# Patient Record
Sex: Female | Born: 1990
Health system: Southern US, Community
[De-identification: ages and names within clinical notes are randomized; demographics above are authoritative.]

## PROBLEM LIST (undated history)

## (undated) DIAGNOSIS — E282 Polycystic ovarian syndrome: Secondary | ICD-10-CM

## (undated) HISTORY — DX: Polycystic ovarian syndrome: E28.2

## (undated) HISTORY — PX: WISDOM TOOTH EXTRACTION: SHX21

---

## 2013-09-05 DIAGNOSIS — IMO0001 Reserved for inherently not codable concepts without codable children: Secondary | ICD-10-CM | POA: Insufficient documentation

## 2013-09-09 ENCOUNTER — Encounter: Payer: Self-pay | Admitting: Internal Medicine

## 2013-11-12 ENCOUNTER — Ambulatory Visit: Payer: Self-pay | Admitting: Internal Medicine

## 2014-05-19 ENCOUNTER — Encounter: Payer: Self-pay | Admitting: Internal Medicine

## 2014-06-19 ENCOUNTER — Encounter: Payer: Self-pay | Admitting: *Deleted

## 2014-07-22 ENCOUNTER — Encounter: Payer: Self-pay | Admitting: Internal Medicine

## 2014-07-22 ENCOUNTER — Ambulatory Visit (INDEPENDENT_AMBULATORY_CARE_PROVIDER_SITE_OTHER): Payer: 59 | Admitting: Internal Medicine

## 2014-07-22 VITALS — BP 104/60 | HR 60 | Ht 62.5 in | Wt 145.0 lb

## 2014-07-22 DIAGNOSIS — Z800 Family history of malignant neoplasm of digestive organs: Secondary | ICD-10-CM

## 2014-07-22 DIAGNOSIS — Z8371 Family history of colonic polyps: Secondary | ICD-10-CM

## 2014-07-22 MED ORDER — NA SULFATE-K SULFATE-MG SULF 17.5-3.13-1.6 GM/177ML PO SOLN: ORAL | Status: DC

## 2014-07-22 NOTE — Patient Instructions (Signed)
  You have been scheduled for a colonoscopy. Please follow written instructions given to you at your visit today.  Please pick up your prep supplies at the pharmacy within the next 1-3 days. If you use inhalers (even only as needed), please bring them with you on the day of your procedure. Your physician has requested that you go to www.startemmi.com and enter the access code given to you at your visit today. This web site gives a general overview about your procedure. However, you should still follow specific instructions given to you by our office regarding your preparation for the procedure.    I appreciate the opportunity to care for you.  

## 2014-07-23 NOTE — Progress Notes (Signed)
Patient ID: Shirley Chavez, female   DOB: 1990/03/08, 24 y.o.   MRN: [id number] HPI: Shirley Chavez is a 24 yo female with PMH of PCOS and family history significant for colorectal cancer in her mother diagnosed age 75 and ulcerative colitis in her sister along with possible colon polyps in her sister who is seen in consultation at the request of Dani Gobble, PA-C to evaluate for elevated risk screening colonoscopy. She is here alone today. 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 She is engaged to be married and plans to get married in September 2017. In general she reports she feels well. She does have stress which she associates with school. This causes sleep impairment and occasional heartburn. She uses [employer] for heartburn. No dysphagia or odynophagia. She denies abdominal pain. Reports bowel movements a been regular without diarrhea or constipation. She denies rectal bleeding or melena. Report considerable stress and anxiety over her mother's death from colon cancer and she often worries that she may suffer colon polyps or even colon cancer. Of note her sister has ulcerative colitis and reportedly had polyps removed today 14. It is unclear if these polyps were inflammatory, pseudopolyps, or precancerous polyps. She takes minimal medications but has a Mirena in place and uses Unisom for sleep. She has recently been diagnosed with PCOS and is on oral contraception  Past Medical History  Diagnosis Date  . PCOS (polycystic ovarian syndrome)     Past Surgical History  Procedure Laterality Date  . Wisdom tooth extraction      No outpatient prescriptions prior to visit.   No facility-administered medications prior to visit.    Allergies  Allergen Reactions  . Amoxicillin Rash  . Sulfa Antibiotics Hives    Family History  Problem Relation Age of Onset  . Colon cancer Mother 35  . Ulcerative colitis Sister   . Colon polyps  Sister   . Prostate cancer Maternal Grandfather   . Diabetes Maternal Grandfather   . Heart disease Paternal Grandfather   . Leukemia Paternal Grandmother     History  Substance Use Topics  . Smoking status: Never Smoker   . Smokeless tobacco: Never Used  . Alcohol Use: No    ROS: As per history of present illness, otherwise negative  BP 104/60 mmHg  Pulse 60  Ht 5' 2.5" (1.588 m)  Wt 145 lb (65.772 kg)  BMI 26.08 kg/m2 Constitutional: Well-developed and well-nourished. No distress. HEENT: Normocephalic and atraumatic. Oropharynx is clear and moist. No oropharyngeal exudate. Conjunctivae are normal.  No scleral icterus. Neck: Neck supple. Trachea midline. Cardiovascular: Normal rate, regular rhythm and intact distal pulses. No M/R/G Pulmonary/chest: Effort normal and breath sounds normal. No wheezing, rales or rhonchi. Abdominal: Soft, nontender, nondistended. Bowel sounds active throughout. There are no masses palpable. No hepatosplenomegaly. Extremities: no clubbing, cyanosis, or edema Lymphadenopathy: No cervical adenopathy noted. Neurological: Alert and oriented to person place and time. Skin: Skin is warm and dry. No rashes noted. Psychiatric: Normal mood and affect. Behavior is normal.   ASSESSMENT/PLAN: 24 yo female with PMH of PCOS and family history significant for colorectal cancer in her mother diagnosed age 75 and ulcerative colitis in her sister along with possible colon polyps in her sister who is seen in consultation at the request of Dani Gobble, PA-C to evaluate for elevated risk screening colonoscopy.   1. Family hx of colon cancer, colon polyps -- given her fear of  colon polyps and cancer along with significant family history of colon cancer in her mother at an early age and colon polyps in her sister, I recommended proceeding with screening colonoscopy at this time. If this test is negative will provide her with considerable reassurance and hopefully  take away fear of the unknown. She will require screening colonoscopy every 5 years given the significant family history. We discussed the test including the risks, benefits, and alternatives and she is agreeable to proceed.   ZO:XWRUEAV C [place], Md [address] [place], [place] [address]

## 2014-07-25 DIAGNOSIS — Z800 Family history of malignant neoplasm of digestive organs: Secondary | ICD-10-CM | POA: Insufficient documentation

## 2014-09-29 ENCOUNTER — Encounter: Payer: 59 | Admitting: Internal Medicine

## 2015-11-05 DIAGNOSIS — Z6821 Body mass index (BMI) 21.0-21.9, adult: Secondary | ICD-10-CM | POA: Diagnosis not present

## 2015-11-05 DIAGNOSIS — L293 Anogenital pruritus, unspecified: Secondary | ICD-10-CM | POA: Diagnosis not present

## 2016-06-13 DIAGNOSIS — Z6822 Body mass index (BMI) 22.0-22.9, adult: Secondary | ICD-10-CM | POA: Diagnosis not present

## 2016-06-13 DIAGNOSIS — L680 Hirsutism: Secondary | ICD-10-CM | POA: Diagnosis not present

## 2016-06-13 DIAGNOSIS — Z01419 Encounter for gynecological examination (general) (routine) without abnormal findings: Secondary | ICD-10-CM | POA: Diagnosis not present

## 2016-06-13 DIAGNOSIS — E282 Polycystic ovarian syndrome: Secondary | ICD-10-CM | POA: Diagnosis not present

## 2016-12-27 ENCOUNTER — Ambulatory Visit: Payer: Self-pay | Admitting: Physician Assistant

## 2016-12-27 ENCOUNTER — Encounter: Payer: Self-pay | Admitting: Physician Assistant

## 2016-12-27 VITALS — BP 100/70 | HR 98 | Temp 98.6°F

## 2016-12-27 DIAGNOSIS — R197 Diarrhea, unspecified: Secondary | ICD-10-CM

## 2016-12-27 NOTE — Progress Notes (Signed)
   Subjective: Diarrhea     Patient ID: Shirley Chavez, female    DOB: 1990-10-23, 26 y.o.   MRN: [id number]  HPI Patient complain of mild abdominal pain, nausea without vomiting, and diarrhea for 1 week. Patient denies fever/chills with these complaints. Patient is concerned because there is a family history of colon cancer. Patient state no relief with over-the-counter Pepto-Bismol. Patient describes the stool as watery. Patient is requested a consult to gastroenterology secondary to family history.   Review of Systems    negative except for complaint Objective:   Physical Exam Negative HSM, normoactive bowel sounds at this time. Soft nontender to palpation.       Assessment & Plan: Diarrhea   Advised over-the-counter Imodium at this time. Discussed consult for gastroenterology if no improvement in 3-5 days. Patient will follow up as needed.

## 2016-12-29 NOTE — Progress Notes (Signed)
Sent referral request to [facility] . Per patient requesting Dr Servando SnareWohl. Per Artist PaisShawna @ GI will contact patient of appointment left message today.

## 2016-12-29 NOTE — Addendum Note (Signed)
Addended by: Yvonne KendallBROWN, Mozell Hardacre D on: 12/29/2016 11:29 AM   Modules accepted: Orders

## 2017-02-09 ENCOUNTER — Encounter: Payer: Self-pay | Admitting: Gastroenterology

## 2017-02-09 ENCOUNTER — Other Ambulatory Visit: Payer: Self-pay

## 2017-02-09 ENCOUNTER — Ambulatory Visit (INDEPENDENT_AMBULATORY_CARE_PROVIDER_SITE_OTHER): Payer: 59 | Admitting: Gastroenterology

## 2017-02-09 VITALS — BP 121/75 | HR 111 | Ht 62.0 in | Wt 125.0 lb

## 2017-02-09 DIAGNOSIS — R197 Diarrhea, unspecified: Secondary | ICD-10-CM

## 2017-02-09 DIAGNOSIS — K921 Melena: Secondary | ICD-10-CM

## 2017-02-09 NOTE — Progress Notes (Signed)
Shirley BouillonVarnita Breylen Agyeman [address]  [address]  [place], [place] [address]  Main: [phone]  Fax: [phone]   Gastroenterology Consultation  Referring Provider:     Eartha InchBadger, Michael C, MD Primary Care Physician:  Eartha InchBadger, Michael C, MD Primary Gastroenterologist:  Dr. Melodie BouillonVarnita Semaje Kinker Reason for Consultation:     Diarrhea        HPI:   Shirley Chavez is a 27 y.o. y/o female referred for consultation & management  by Dr. Cyndia BentBadger, Kayleen MemosMichael C, MD.  Patient reports 02-9063-month history of diarrhea.  Initially started around Thanksgiving time, with around 5 loose bowel movements a day for a week with bright red blood on tissue paper at the time.  No bright red blood in the stool itself.  After that week, patient has had 2-3 bowel movements daily.  Prior to this, she was having one formed bowel movement daily.  Also reports bilateral lower quadrant cramping, 3/10 since then.  Intermittently worsens after meals.  She also had nausea when the symptoms first started but does not have any at this time.  No vomiting.  No fever or chills.  No weight loss.  No previous colonoscopy or EGDs.  No dysphagia.  Try to change her diet, including avoidance of gluten which did not help her symptoms.  Also describes urgency with her bowel movements since the symptoms started.  Has family history of colon cancer, mother was diagnosed with colon cancer at 27 years of age.  Sister was diagnosed with ulcerative colitis when she was 6014.  No recent changes in medications.  Uses naproxen about once every 2 weeks.  Past Medical History:  Diagnosis Date  . PCOS (polycystic ovarian syndrome)     Past Surgical History:  Procedure Laterality Date  . WISDOM TOOTH EXTRACTION      Prior to Admission medications   Medication Sig Start Date End Date Taking? Authorizing Provider  [facility] 1-35 MG-MCG tablet  06/24/14  Yes [provider]  levonorgestrel (MIRENA) 20 MCG/24HR IUD by Intrauterine route.   Yes  [provider]    Family History  Problem Relation Age of Onset  . Colon cancer Mother 5540  . Ulcerative colitis Sister   . Colon polyps Sister   . Prostate cancer Maternal Grandfather   . Diabetes Maternal Grandfather   . Heart disease Paternal Grandfather   . Leukemia Paternal Grandmother      Social History   Tobacco Use  . Smoking status: Never Smoker  . Smokeless tobacco: Never Used  Substance Use Topics  . Alcohol use: No    Alcohol/week: 0.0 oz  . Drug use: No    Allergies as of 02/09/2017 - Review Complete 02/09/2017  Allergen Reaction Noted  . Amoxicillin Rash 07/22/2014  . Sulfa antibiotics Hives 07/22/2014    Review of Systems:    All systems reviewed and negative except where noted in HPI.   Physical Exam:  BP 121/75   Pulse (!) 111   Ht 5\' 2"  (1.575 m)   Wt 125 lb (56.7 kg)   BMI 22.86 kg/m  No LMP recorded. Patient is not currently having periods (Reason: IUD). Psych:  Alert and cooperative. Normal mood and affect. General:   Alert,  Well-developed, well-nourished, pleasant and cooperative in NAD Head:  Normocephalic and atraumatic. Eyes:  Sclera clear, no icterus.   Conjunctiva pink. Ears:  Normal auditory acuity. Nose:  No deformity, discharge, or lesions. Mouth:  No deformity or lesions,oropharynx pink & moist. Neck:  Supple; no masses or thyromegaly. Lungs:  Respirations even and unlabored.  Clear throughout to auscultation.   No wheezes, crackles, or rhonchi. No acute distress. Heart:  Regular rate and rhythm; no murmurs, clicks, rubs, or gallops.  No edema Abdomen:  Normal bowel sounds.  No bruits.  Soft, mildly tender to palpation bilateral lower quadrant, and non-distended without masses, no hepatosplenomegaly or hernias noted.  No guarding or rebound tenderness.    Extremities:  No clubbing or edema.  No cyanosis. Neurologic:  Alert and oriented x3;  grossly normal neurologically. Skin:  Intact without significant lesions or  rashes. No jaundice. Lymph Nodes:  No significant cervical adenopathy. Psych:  Alert and cooperative. Normal mood and affect.   Labs: CBC 2017 labs available in care everywhere were reviewed.  No anemia present in that lab work. Imaging Studies: No results found.  Assessment and Plan:   Shirley Chavez is a 27 y.o. y/o female has been referred for abdominal pain and diarrhea for 1-2 months with his family history of colon cancer and family history of ulcerative colitis.  Patient's diarrhea could be due to postinfectious syndrome, postinfectious IBS However, she also has urgency and bilateral lower quadrant abdominal pain since her symptoms started, and has family history of ulcerative colitis.  Therefore, further investigation is needed to evaluate for IBD.  She is also the right age when IBD presents. We will start with blood work, including inflammatory markers.  We will also obtain celiac serology. After blood work is reviewed, we will discuss it with patient further, and if symptoms persist, proceed with colonoscopy to evaluate for IBD. She is also eligible for higher risk cancer screening given her family history of colon cancer.  This would start at 27 years of age for her. Obtaining a CT scan, after lab work is obtained is also an alternative to colonoscopy.  This was discussed with the patient. We will be in touch with her after lab work is obtained to discuss further steps.  She is agreeable with this plan. Since symptoms are chronic, stool cultures are unlikely to be positive.  She would like to start with blood work only at this time, which is reasonable.  Dr Shirley Chavez

## 2017-02-13 LAB — COMPREHENSIVE METABOLIC PANEL
ALK PHOS: 40 IU/L (ref 39–117)
ALT: 10 IU/L (ref 0–32)
AST: 18 IU/L (ref 0–40)
Albumin/Globulin Ratio: 1.7 (ref 1.2–2.2)
Albumin: 4.6 g/dL (ref 3.5–5.5)
BUN/Creatinine Ratio: 10 (ref 9–23)
BUN: 7 mg/dL (ref 6–20)
Bilirubin Total: 0.2 mg/dL (ref 0.0–1.2)
CALCIUM: 9.7 mg/dL (ref 8.7–10.2)
CO2: 24 mmol/L (ref 20–29)
CREATININE: 0.67 mg/dL (ref 0.57–1.00)
Chloride: 103 mmol/L (ref 96–106)
GFR calc Af Amer: 140 mL/min/{1.73_m2} (ref >59)
GFR, EST NON AFRICAN AMERICAN: 122 mL/min/{1.73_m2} (ref >59)
Globulin, Total: 2.7 g/dL (ref 1.5–4.5)
Glucose: 89 mg/dL (ref 65–99)
POTASSIUM: 3.9 mmol/L (ref 3.5–5.2)
Sodium: 141 mmol/L (ref 134–144)
Total Protein: 7.3 g/dL (ref 6.0–8.5)

## 2017-02-13 LAB — CBC WITH DIFFERENTIAL/PLATELET
BASOS ABS: 0.1 10*3/uL (ref 0.0–0.2)
Basos: 1 %
EOS (ABSOLUTE): 0.1 10*3/uL (ref 0.0–0.4)
EOS: 1 %
Hematocrit: 37.6 % (ref 34.0–46.6)
Hemoglobin: 12.5 g/dL (ref 11.1–15.9)
IMMATURE GRANULOCYTES: 0 %
Immature Grans (Abs): 0 10*3/uL (ref 0.0–0.1)
Lymphocytes Absolute: 3 10*3/uL (ref 0.7–3.1)
Lymphs: 35 %
MCH: 30.2 pg (ref 26.6–33.0)
MCHC: 33.2 g/dL (ref 31.5–35.7)
MCV: 91 fL (ref 79–97)
MONOS ABS: 0.4 10*3/uL (ref 0.1–0.9)
Monocytes: 4 %
NEUTROS PCT: 59 %
Neutrophils Absolute: 5.1 10*3/uL (ref 1.4–7.0)
PLATELETS: 299 10*3/uL (ref 150–379)
RBC: 4.14 x10E6/uL (ref 3.77–5.28)
RDW: 12.4 % (ref 12.3–15.4)
WBC: 8.5 10*3/uL (ref 3.4–10.8)

## 2017-02-13 LAB — SEDIMENTATION RATE: SED RATE: 6 mm/h (ref 0–32)

## 2017-02-13 LAB — IGA: IgA/Immunoglobulin A, Serum: 62 mg/dL — ABNORMAL LOW (ref 87–352)

## 2017-02-13 LAB — TISSUE TRANSGLUTAMINASE ABS,IGG,IGA

## 2017-02-13 LAB — C-REACTIVE PROTEIN: CRP: 4.9 mg/L (ref 0.0–4.9)

## 2017-02-27 ENCOUNTER — Ambulatory Visit: Payer: Self-pay | Admitting: Gastroenterology

## 2017-05-08 ENCOUNTER — Ambulatory Visit: Payer: 59 | Admitting: Gastroenterology

## 2017-05-15 ENCOUNTER — Ambulatory Visit: Payer: Self-pay | Admitting: Gastroenterology

## 2017-07-03 ENCOUNTER — Ambulatory Visit: Payer: 59 | Admitting: Gastroenterology

## 2017-07-10 DIAGNOSIS — Z01419 Encounter for gynecological examination (general) (routine) without abnormal findings: Secondary | ICD-10-CM | POA: Diagnosis not present

## 2017-07-10 DIAGNOSIS — Z6822 Body mass index (BMI) 22.0-22.9, adult: Secondary | ICD-10-CM | POA: Diagnosis not present

## 2017-07-10 DIAGNOSIS — E282 Polycystic ovarian syndrome: Secondary | ICD-10-CM | POA: Diagnosis not present

## 2017-09-15 ENCOUNTER — Other Ambulatory Visit: Payer: Self-pay | Admitting: Family Medicine

## 2017-09-15 MED ORDER — PERMETHRIN 5 % EX CREA: 1.0000 "application " | TOPICAL_CREAM | Freq: Once | CUTANEOUS | 0 refills | Status: AC

## 2017-10-31 ENCOUNTER — Encounter: Payer: Self-pay | Admitting: Physician Assistant

## 2017-10-31 ENCOUNTER — Ambulatory Visit: Payer: Self-pay | Admitting: Physician Assistant

## 2017-10-31 VITALS — BP 110/86 | HR 99 | Temp 97.8°F | Wt 123.0 lb

## 2017-10-31 DIAGNOSIS — N3001 Acute cystitis with hematuria: Secondary | ICD-10-CM

## 2017-10-31 DIAGNOSIS — R300 Dysuria: Secondary | ICD-10-CM

## 2017-10-31 LAB — POCT URINALYSIS DIPSTICK
Bilirubin, UA: NEGATIVE
Blood, UA: POSITIVE
Glucose, UA: NEGATIVE
Ketones, UA: NEGATIVE
LEUKOCYTES UA: NEGATIVE
NITRITE UA: NEGATIVE
PROTEIN UA: NEGATIVE
Urobilinogen, UA: 0.2 E.U./dL
pH, UA: 5 (ref 5.0–8.0)

## 2017-10-31 MED ORDER — NITROFURANTOIN MONOHYD MACRO 100 MG PO CAPS: 100.0000 mg | ORAL_CAPSULE | Freq: Two times a day (BID) | ORAL | 0 refills | Status: AC

## 2017-10-31 MED ORDER — ONDANSETRON HCL 4 MG PO TABS: 4.0000 mg | ORAL_TABLET | Freq: Three times a day (TID) | ORAL | 0 refills | Status: AC | PRN

## 2017-10-31 MED ORDER — PHENAZOPYRIDINE HCL 200 MG PO TABS: 200.0000 mg | ORAL_TABLET | Freq: Three times a day (TID) | ORAL | 0 refills | Status: AC | PRN

## 2017-10-31 NOTE — Patient Instructions (Addendum)
Thank you for choosing [facility] for your health care needs today.  Your symptoms are consistent with a UTI. Your urinalysis performed in office today revealed specific gravity of <1.005, scant blood, no leukocytes, and no nitrites.   Take antibiotic, Macrobid, as prescribed. 1 pill bid x 5 days. Prescribed Zofran, for nausea secondary to antibiotic. Prescribed Pyridium, for dysuria.  Follow-up at PCP's office or urgent care in 2 days if symptoms not improving, you will most likely need your urine sent out for culture for further evaluation.  Urinary Tract Infection, Adult  A urinary tract infection (UTI) is an infection of any part of the urinary tract. The urinary tract includes the:  Kidneys.  Ureters.  Bladder.  Urethra.  These organs make, store, and get rid of pee (urine) in the body. Follow these instructions at home:  Take over-the-counter and prescription medicines only as told by your doctor.  If you were prescribed an antibiotic medicine, take it as told by your doctor. Do not stop taking the antibiotic even if you start to feel better.  Avoid the following drinks: ? Alcohol. ? Caffeine. ? Tea. ? Carbonated drinks.  Drink enough fluid to keep your pee clear or pale yellow.  Keep all follow-up visits as told by your doctor. This is important.  Make sure to: ? Empty your bladder often and completely. Do not to hold pee for long periods of time. ? Empty your bladder before and after sex. ? Wipe from front to back after a bowel movement if you are female. Use each tissue one time when you wipe. Contact a doctor if:  You have back pain.  You have a fever.  You feel sick to your stomach (nauseous).  You throw up (vomit).  Your symptoms do not get better after 3 days.  Your symptoms go away and then come back. Get help right away if:  You have very bad back pain.  You have very bad lower belly (abdominal) pain.  You are throwing up and cannot keep  down any medicines or water. This information is not intended to replace advice given to you by your health care provider. Make sure you discuss any questions you have with your health care provider. Document Released: 07/06/2007 Document Revised: 06/25/2015 Document Reviewed: 12/08/2014 Elsevier Interactive Patient Education  [employer].

## 2017-10-31 NOTE — Progress Notes (Signed)
Patient ID: Shirley Chavez DOB: 08-04-1990 AGE: 27 y.o. MRN: [id number]   PCP: Eartha Inch, MD   Chief Complaint:  Chief Complaint  Patient presents with  . save-dysuria     Subjective:    HPI:  Shirley Chavez is a 27 y.o. female presents for evaluation  Chief Complaint  Patient presents with  . save-dysuria   27 year old female presents to [facility] with 2 day history of dysuria. Burning sensation at start of urinary stream. Moderate in severity. Associated hematuria (small amount yesterday when wiping), increased urinary frequency, nocturia (was up all night urinating), sensation of incomplete voiding, scant amount of urine when urinating, and suprapubic pressure/discomfort. Symptoms persistent. Has taken OTC Azo and increased fluid intake with no symptom improvement. Patient with one previous UTI in her history, 7 years ago, current symptoms feel similar.  Patient in monogamous relationship with one partner. Denies concern for STI.  Patient denies fever, chills, headache, back ache, abdominal pain, nausea/vomiting, vaginal rash/discharge/pruritis, urinary incontinence.  A complete, at least 10 system review of symptoms was performed, pertinent positives and negatives as mentioned in HPI, otherwise negative.  The following portions of the patient's history were reviewed and updated as appropriate: allergies, current medications and past medical history.  There are no active problems to display for this patient.   Allergies  Allergen Reactions  . Amoxicillin Rash  . Sulfa Antibiotics Hives    Current Outpatient Medications on File Prior to Visit  Medication Sig Dispense Refill  . levonorgestrel (MIRENA) 20 MCG/24HR IUD by Intrauterine route.     No current facility-administered medications on file prior to visit.        Objective:    Vitals:   10/31/17 1132  BP: 110/86  Pulse: 99  Temp: 97.8 F (36.6 C)  SpO2: 100%     Wt Readings from Last 3  Encounters:  10/31/17 123 lb (55.8 kg)  02/09/17 125 lb (56.7 kg)  07/22/14 145 lb (65.8 kg)    Physical Exam:   General Appearance:  Alert, cooperative, no distress, appears stated age. Afebrile.  Head:  Normocephalic, without obvious abnormality, atraumatic  Eyes:  PERRL, conjunctiva/corneas clear, EOM's intact, fundi benign, both eyes  Ears:  Normal TM's and external ear canals, both ears  Nose: Nares normal, septum midline,mucosa normal, no drainage or sinus tenderness  Throat: Lips, mucosa, and tongue normal; teeth and gums normal  Neck: Supple, symmetrical, trachea midline, no adenopathy;  thyroid: not enlarged, symmetric, no tenderness/mass/nodules; no carotid bruit or JVD  Back:   Symmetric, no curvature, ROM normal, no CVA tenderness to percussion bilaterally.  Lungs:   Clear to auscultation bilaterally, respirations unlabored  Heart:  Regular rate and rhythm, S1 and S2 normal, no murmur, rub, or gallop  Abdomen:   Soft, non-tender, bowel sounds active all four quadrants,  no masses, no organomegaly  Extremities: Extremities normal, atraumatic, no cyanosis or edema  Pulses: 2+ and symmetric  Skin: Skin color, texture, turgor normal, no rashes or lesions  Lymph nodes: Cervical, supraclavicular, and axillary nodes normal  Neurologic: Normal    Assessment & Plan:    Exam findings, diagnosis etiology and medication use and indications reviewed with patient. Follow-Up and discharge instructions provided. No emergent/urgent issues found on exam.  Patient education was provided.   Patient verbalized understanding of information provided and agrees with plan of care (POC), all questions answered. The patient is advised to call or return to clinic if condition does not see an improvement in  symptoms, or to seek the care of the closest emergency department if condition worsens with the above plan.   Urinalysis performed in office today, 10/31/2017, revealed: Blood + 10/RBC/uL Bil  - neg Uro +- norm Ket - neg Pro - neg Nit - neg Glu - neg P.H 5.0 S.G <1.005 Leu - neg Col Lt. Yellow Cla Clear  1. Dysuria  - POCT Urinalysis Dipstick - nitrofurantoin, macrocrystal-monohydrate, (MACROBID) 100 MG capsule; Take 1 capsule (100 mg total) by mouth 2 (two) times daily for 5 days.  Dispense: 10 capsule; Refill: 0 - phenazopyridine (PYRIDIUM) 200 MG tablet; Take 1 tablet (200 mg total) by mouth 3 (three) times daily as needed for up to 4 days for pain.  Dispense: 10 tablet; Refill: 0  2. Acute cystitis with hematuria  - nitrofurantoin, macrocrystal-monohydrate, (MACROBID) 100 MG capsule; Take 1 capsule (100 mg total) by mouth 2 (two) times daily for 5 days.  Dispense: 10 capsule; Refill: 0   Patient with 2 day history of UTI symptoms. UA very dilute; however, hematuria. Based on symptoms will treat patient with 5-day course of Macrobid and Pyridium for UTI. Patient reports nausea secondary to antibiotics, prescribed Zofran. Patient advised to f/u at PCP's office or urgent care in 2 days if symptoms not improving, will need a repeat UA and a urine culture. Patient agrees with plan.  Rulon Sera, MHS, PA-C Advanced Practice Provider [facility]  [address], [facility], 1st Floor [place], [place] [address] (p):  [phone] [email or web address]

## 2017-11-03 ENCOUNTER — Telehealth: Payer: Self-pay | Admitting: Emergency Medicine

## 2017-11-03 NOTE — Telephone Encounter (Signed)
Attempted to contact patient unable to mailbox is full This was a follow up call from an [place] visit

## 2018-02-26 ENCOUNTER — Ambulatory Visit: Payer: Self-pay | Admitting: Family Medicine

## 2018-03-15 ENCOUNTER — Ambulatory Visit: Payer: Self-pay | Admitting: Family Medicine

## 2018-04-17 ENCOUNTER — Encounter: Payer: Self-pay | Admitting: Family Medicine

## 2018-04-17 ENCOUNTER — Ambulatory Visit (INDEPENDENT_AMBULATORY_CARE_PROVIDER_SITE_OTHER): Payer: No Typology Code available for payment source | Admitting: Family Medicine

## 2018-04-17 ENCOUNTER — Ambulatory Visit: Payer: Self-pay | Admitting: Family Medicine

## 2018-04-17 ENCOUNTER — Other Ambulatory Visit: Payer: Self-pay

## 2018-04-17 VITALS — BP 106/68 | HR 88 | Temp 98.0°F | Resp 16 | Ht 62.0 in | Wt 126.2 lb

## 2018-04-17 DIAGNOSIS — K5900 Constipation, unspecified: Secondary | ICD-10-CM

## 2018-04-17 DIAGNOSIS — Z800 Family history of malignant neoplasm of digestive organs: Secondary | ICD-10-CM

## 2018-04-17 DIAGNOSIS — E282 Polycystic ovarian syndrome: Secondary | ICD-10-CM

## 2018-04-17 DIAGNOSIS — Z114 Encounter for screening for human immunodeficiency virus [HIV]: Secondary | ICD-10-CM

## 2018-04-17 DIAGNOSIS — K625 Hemorrhage of anus and rectum: Secondary | ICD-10-CM

## 2018-04-17 DIAGNOSIS — Z1159 Encounter for screening for other viral diseases: Secondary | ICD-10-CM

## 2018-04-17 DIAGNOSIS — E7800 Pure hypercholesterolemia, unspecified: Secondary | ICD-10-CM

## 2018-04-17 DIAGNOSIS — Z975 Presence of (intrauterine) contraceptive device: Secondary | ICD-10-CM

## 2018-04-17 NOTE — Progress Notes (Signed)
Name: Shirley Chavez   MRN: [id number]    DOB: 07/27/1990   Date:04/17/2018       Progress Note  Subjective  Chief Complaint  Chief Complaint  Patient presents with  . Establish Care  . Referral    GI, IUD/pap for GYN    HPI  Pt presents to establish care and for the following:  Diarrhea: Had viral illness that caused significant diarrhea a few years ago, did go to see GI at that point but was told it was post-infectious diarrhea, improved for about a year after decreasing fiber intake, but is now experiencing constipation, and intermittent blood in stool.  Has increased her fiber, and this has helped a little bit, but still concerned.  No abdominal pain, nausea, or vomiting. Her mother has stage 4 CRC at age 1 and passed away from this, sister has ulcerative colitis.   We will refer today.  IUD in place, due for Pap: Has had IUD (Mirena) in place for 7 years.  Used to see OB/GYN in [facility], needs new referral.  Used to be OCP and stopped because she had N/V.  Wants to have IUD switched out and have Pap.  PCOS: Ongoing for many years, has Mirena in place without issues.  No polydipsia, polyuria, or polyphagia.  Does note history of elevated LDL cholesterol - will check labs today.  Patient Active Problem List   Diagnosis Date Noted  . PCOS (polycystic ovarian syndrome) 04/17/2018  . FH: colon cancer 07/25/2014  . Well adult 09/05/2013    Past Surgical History:  Procedure Laterality Date  . WISDOM TOOTH EXTRACTION      Family History  Problem Relation Age of Onset  . Colon cancer Mother 58  . Ulcerative colitis Sister   . Colon polyps Sister   . Prostate cancer Maternal Grandfather   . Diabetes Maternal Grandfather   . Heart disease Paternal Grandfather   . Heart attack Paternal Grandfather   . Leukemia Paternal Grandmother   . Hypertension Father   . Diabetes Maternal Grandmother   . Stroke Maternal Grandmother     Social History   Socioeconomic History  .  Marital status: Married    Spouse name: Issack  . Number of children: 0  . Years of education: 109  . Highest education level: Bachelor's degree (e.g., BA, AB, BS)  Occupational History  . Not on file  Social Needs  . Financial resource strain: Not hard at all  . Food insecurity:    Worry: Never true    Inability: Never true  . Transportation needs:    Medical: No    Non-medical: No  Tobacco Use  . Smoking status: Never Smoker  . Smokeless tobacco: Never Used  Substance and Sexual Activity  . Alcohol use: No    Alcohol/week: 0.0 standard drinks  . Drug use: No  . Sexual activity: Yes  Lifestyle  . Physical activity:    Days per week: 2 days    Minutes per session: 30 min  . Stress: Not at all  Relationships  . Social connections:    Talks on phone: Three times a week    Gets together: Once a week    Attends religious service: Never    Active member of club or organization: No    Attends meetings of clubs or organizations: Never    Relationship status: Married  . Intimate partner violence:    Fear of current or ex partner: No    Emotionally  abused: No    Physically abused: No    Forced sexual activity: No  Other Topics Concern  . Not on file  Social History Narrative  . Not on file     Current Outpatient Medications:  .  levonorgestrel (MIRENA) 20 MCG/24HR IUD, by Intrauterine route., Disp: , Rfl:   Allergies  Allergen Reactions  . Amoxicillin Rash  . Sulfa Antibiotics Hives    I personally reviewed active problem list, medication list, allergies, health maintenance, lab results with the patient/caregiver today.   ROS  Constitutional: Negative for fever or weight change.  Respiratory: Negative for cough and shortness of breath.   Cardiovascular: Negative for chest pain or palpitations.  Gastrointestinal: Negative for abdominal pain, no bowel changes.  Musculoskeletal: Negative for gait problem or joint swelling.  Skin: Negative for rash.   Neurological: Negative for dizziness or headache.  No other specific complaints in a complete review of systems (except as listed in HPI above).  Objective  Vitals:   04/17/18 1305  BP: 106/68  Pulse: 88  Resp: 16  Temp: 98 F (36.7 C)  TempSrc: Oral  SpO2: 97%  Weight: 126 lb 3.2 oz (57.2 kg)  Height: 5\' 2"  (1.575 m)   Body mass index is 23.08 kg/m.  Physical Exam  Constitutional: Patient appears well-developed and well-nourished. No distress.  HEENT: head atraumatic, normocephalic Cardiovascular: Normal rate, regular rhythm and normal heart sounds.  No murmur heard. No BLE edema. Pulmonary/Chest: Effort normal and breath sounds clear bilaterally. No respiratory distress. Abdominal: Soft, bowel sounds normal, there is no tenderness, no HSM Psychiatric: Patient has a normal mood and affect. behavior is normal. Judgment and thought content normal.  No results found for this or any previous visit (from the past 72 hour(s)).  PHQ2/9: Depression screen PHQ 2/9 04/17/2018  Decreased Interest 0  Down, Depressed, Hopeless 0  PHQ - 2 Score 0  Altered sleeping 0  Tired, decreased energy 0  Change in appetite 0  Feeling bad or failure about yourself  0  Trouble concentrating 0  Suicidal thoughts 0  PHQ-9 Score 0  Difficult doing work/chores Not difficult at all   PHQ-2/9 Result is negative.    Fall Risk: Fall Risk  04/17/2018  Falls in the past year? 0  Number falls in past yr: 0  Injury with Fall? 0   Functional Status Survey: Is the patient deaf or have difficulty hearing?: No Does the patient have difficulty seeing, even when wearing glasses/contacts?: Yes Does the patient have difficulty concentrating, remembering, or making decisions?: No Does the patient have difficulty walking or climbing stairs?: No Does the patient have difficulty dressing or bathing?: No Does the patient have difficulty doing errands alone such as visiting a doctor's office or shopping?: No   Assessment & Plan  1. PCOS (polycystic ovarian syndrome) - COMPLETE METABOLIC PANEL WITH GFR - TSH  2. Constipation, unspecified constipation type - Ambulatory referral to Gastroenterology - CBC with Differential/Platelet  3. Family history of colorectal cancer - Ambulatory referral to Gastroenterology - CBC with Differential/Platelet  4. Encounter for screening for HIV - HIV Antibody (routine testing w rflx)  5. Need for hepatitis C screening test - Hepatitis C antibody  6. Elevated LDL cholesterol level - Lipid panel - TSH  7. Rectal bleeding - CBC with Differential/Platelet  8. IUD (intrauterine device) in place - Ambulatory referral to Gynecology

## 2018-04-18 LAB — COMPLETE METABOLIC PANEL WITH GFR
AG Ratio: 2 (calc) (ref 1.0–2.5)
ALBUMIN MSPROF: 5.1 g/dL (ref 3.6–5.1)
ALKALINE PHOSPHATASE (APISO): 63 U/L (ref 31–125)
ALT: 28 U/L (ref 6–29)
AST: 28 U/L (ref 10–30)
BUN: 10 mg/dL (ref 7–25)
CALCIUM: 10.4 mg/dL — AB (ref 8.6–10.2)
CO2: 26 mmol/L (ref 20–32)
CREATININE: 0.81 mg/dL (ref 0.50–1.10)
Chloride: 102 mmol/L (ref 98–110)
GFR, EST AFRICAN AMERICAN: 115 mL/min/{1.73_m2} (ref >=60)
GFR, EST NON AFRICAN AMERICAN: 100 mL/min/{1.73_m2} (ref >=60)
GLOBULIN: 2.6 g/dL (ref 1.9–3.7)
Glucose, Bld: 82 mg/dL (ref 65–99)
Potassium: 4.2 mmol/L (ref 3.5–5.3)
Sodium: 139 mmol/L (ref 135–146)
TOTAL PROTEIN: 7.7 g/dL (ref 6.1–8.1)
Total Bilirubin: 0.6 mg/dL (ref 0.2–1.2)

## 2018-04-18 LAB — CBC WITH DIFFERENTIAL/PLATELET
ABSOLUTE MONOCYTES: 330 {cells}/uL (ref 200–950)
BASOS ABS: 72 {cells}/uL (ref 0–200)
Basophils Relative: 1.2 %
EOS ABS: 120 {cells}/uL (ref 15–500)
Eosinophils Relative: 2 %
HEMATOCRIT: 36.8 % (ref 35.0–45.0)
Hemoglobin: 12.5 g/dL (ref 11.7–15.5)
LYMPHS ABS: 2226 {cells}/uL (ref 850–3900)
MCH: 29.8 pg (ref 27.0–33.0)
MCHC: 34 g/dL (ref 32.0–36.0)
MCV: 87.6 fL (ref 80.0–100.0)
MPV: 12.9 fL — ABNORMAL HIGH (ref 7.5–12.5)
Monocytes Relative: 5.5 %
NEUTROS PCT: 54.2 %
Neutro Abs: 3252 cells/uL (ref 1500–7800)
Platelets: 261 10*3/uL (ref 140–400)
RBC: 4.2 10*6/uL (ref 3.80–5.10)
RDW: 11.6 % (ref 11.0–15.0)
Total Lymphocyte: 37.1 %
WBC: 6 10*3/uL (ref 3.8–10.8)

## 2018-04-18 LAB — HEPATITIS C ANTIBODY
Hepatitis C Ab: NONREACTIVE
SIGNAL TO CUT-OFF: 0.01 (ref <1.00)

## 2018-04-18 LAB — LIPID PANEL
Cholesterol: 195 mg/dL (ref <200)
HDL: 66 mg/dL (ref >=50)
LDL Cholesterol (Calc): 108 mg/dL (calc) — ABNORMAL HIGH
NON-HDL CHOLESTEROL (CALC): 129 mg/dL (ref <130)
Total CHOL/HDL Ratio: 3 (calc) (ref <5.0)
Triglycerides: 109 mg/dL (ref <150)

## 2018-04-18 LAB — HIV ANTIBODY (ROUTINE TESTING W REFLEX): HIV 1&2 Ab, 4th Generation: NONREACTIVE

## 2018-04-18 LAB — TSH: TSH: 1.53 mIU/L

## 2018-04-27 ENCOUNTER — Ambulatory Visit (INDEPENDENT_AMBULATORY_CARE_PROVIDER_SITE_OTHER): Payer: Self-pay | Admitting: Gastroenterology

## 2018-04-27 ENCOUNTER — Encounter: Payer: Self-pay | Admitting: Gastroenterology

## 2018-04-27 DIAGNOSIS — K625 Hemorrhage of anus and rectum: Secondary | ICD-10-CM

## 2018-04-27 NOTE — Progress Notes (Signed)
Shirley Bouillon, MD [address]  [address]  [place], [place] [address]  Main: [phone]  Fax: [phone]   Primary Care Physician: Doren Custard, FNP  Virtual Visit via Telephone Note  I connected with patient on 04/27/18 at 11:00 AM EDT by telephone and verified that I am speaking with the correct person using two identifiers.   I discussed the limitations, risks, security and privacy concerns of performing an evaluation and management service by telephone and the availability of in person appointments. I also discussed with the patient that there may be a patient responsible charge related to this service. The patient expressed understanding and agreed to proceed.  Location of Patient: Work, in a Armed forces training and education officer of Provider: Home Persons involved: Patient and provider only   History of Present Illness: Chief Complaint  Patient presents with  . Constipation    states she was seen by you last year   . 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  Patient's work-up at that time was reassuring, and the options of colonoscopy versus conservative management were discussed in detail and based on discussion with the patient, patient chose conservative management at the time.  Patient states since her last visit, she had been doing well and her diarrhea had completely resolved and she had developed constipation.  She varied her fiber intake and at times she will have loose stools because of this and then varies her fiber intake again.  Overall, states she is doing well and denies any alarm symptoms.  Over the last week, reports 1-2 formed bowel movements a day.  However, does state that she has occasional episodes of straining and then will see bright red blood on the toilet paper only and none in the stool itself.  This is occurred about once or twice.   The patient denies abdominal or flank pain, anorexia, nausea or vomiting, dysphagia, black stools or weight loss.  Family history significant for mother diagnosed with colon cancer, stage IV at 80 years of age.  Sister was diagnosed with ulcerative colitis when she was 61.  Due to her family history, patient states she always worries if everything is normal, and previously had decided against colonoscopy, but would like to consider it at this time.  We had previously obtained work-up for celiac serology which had been negative a year ago.  She is able to eat gluten without any symptoms.  She does state that when she consumes dairy or lactose products she feels abdominal bloating, but as long as she avoids lactose the symptoms do not recur.  Current Outpatient Medications  Medication Sig Dispense Refill  . fexofenadine-pseudoephedrine (ALLEGRA-D 24) 180-240 MG 24 hr tablet Take 1 tablet by mouth daily.    Marland Kitchen levonorgestrel (MIRENA) 20 MCG/24HR IUD by Intrauterine route.     No current facility-administered medications for this visit.     Allergies as of 04/27/2018 - Review Complete 04/27/2018  Allergen Reaction Noted  . Amoxicillin Rash 07/22/2014  . Sulfa antibiotics Hives 07/22/2014    Review of Systems:    All systems reviewed and negative except where noted in HPI.   Observations/Objective:  Labs: CMP     Component Value Date/Time   NA 139 04/17/2018 1343   NA 141 02/09/2017 1630   K 4.2 04/17/2018 1343   CL 102 04/17/2018  1343   CO2 26 04/17/2018 1343   GLUCOSE 82 04/17/2018 1343   BUN 10 04/17/2018 1343   BUN 7 02/09/2017 1630   CREATININE 0.81 04/17/2018 1343   CALCIUM 10.4 (H) 04/17/2018 1343   PROT 7.7 04/17/2018 1343   PROT 7.3 02/09/2017 1630   ALBUMIN 4.6 02/09/2017 1630   AST 28 04/17/2018 1343   ALT 28 04/17/2018 1343   ALKPHOS 40 02/09/2017 1630   BILITOT 0.6 04/17/2018 1343   BILITOT 0.2 02/09/2017 1630   GFRNONAA 100 04/17/2018 1343   GFRAA 115  04/17/2018 1343   Lab Results  Component Value Date   WBC 6.0 04/17/2018   HGB 12.5 04/17/2018   HCT 36.8 04/17/2018   MCV 87.6 04/17/2018   PLT 261 04/17/2018    Imaging Studies: No results found.  Assessment and Plan:   Shirley Chavez is a 28 y.o. 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  Her symptoms are very reassuring and she agrees with the same.  She does not feel any external hemorrhoids, however, she has very intermittent bright red blood per rectum, which only occurs when straining and she has some itching in the area as well when this occurs, so she likely has underlying internal hemorrhoids.  However, since she has had to vary her fiber intake throughout the year to finally maintain normal bowel movements, and has had alternating constipation and diarrhea throughout the year, a colonoscopy to rule out IBD and obtain biopsies for microscopic colitis is reasonable at this time.  She does not have any upper GI symptoms at this time, we also discussed the possibility of EGD with a colonoscopy to rule out any upper GI bleed lesions.  She is not interested in the EGD at this time which is reasonable as well.  I have discussed alternative options, risks & benefits,  which include, but are not limited to, bleeding, infection, perforation,respiratory complication & drug reaction.  The patient agrees with this plan & written consent will be obtained.    Due to the current COVID-19 situation,  elective procedures are currently being scheduled for a later time as per nationwide recommendations. Therefore, the patient was informed of the need to schedule the procedure in the upcoming months, instead of sooner, since this is an elective procedure. Patient will need to be contacted at a later time to place him/her on the schedule. However, alarm symptoms were discussed in detail, and if these occur pt was advised to call us to discuss change in symptoms and evaluation for a more urgent procedure if appropriate. No indication for urgent procedure exist at this time. Patient was given the contact information to reach [place] with any questions, concerns, or change in symptoms.   High-fiber diet MiraLAX or Metamucil daily with goal of 1-2 soft bowel movements daily.  If not at goal, patient instructed to increase dose to twice daily.  If loose stools with the medication, patient asked to decrease the medication to every other day, or half  dose daily.  Patient verbalized understanding  Follow Up Instructions: I have asked the patient to call us in 2 to 3 months if she has not already heard from [place] by that time to schedule her colonoscopy.  Her colonoscopy would be for intermittent bright red blood per rectum.  We will contact her in the next 2 to 3 months once we are back to a normal schedule,  to schedule her elective procedure.  I have asked her to call us with any changes in symptoms or any questions or concerns and she verbalized understanding.   I discussed the assessment and treatment plan with the patient. The patient was provided an opportunity to ask questions and all were answered. The patient agreed with the plan and demonstrated an understanding of the instructions.   The patient was advised to call back or seek an in-person evaluation if the symptoms worsen or if the condition fails to improve as anticipated.  I provided 12 minutes of non-face-to-face time during this encounter.   Pasty Spillers, MD  Speech recognition software was used to dictate this note.

## 2018-07-02 ENCOUNTER — Telehealth: Payer: Self-pay

## 2018-07-02 NOTE — Telephone Encounter (Signed)
-----   Message from Pasty Spillers, MD sent at 06/11/2018 12:04 PM EDT ----- Please let the pt know we are now opening procedure scheduling slowly. If she is agreeable, schedule her for a Tuesday in [place]. I am trying to start filling Tuesday slots in [place].

## 2018-07-02 NOTE — Telephone Encounter (Signed)
LMTCO. Need to schedule colonoscopy for intermittent BRBPR. Left on message, would she want to have this done in [place].

## 2018-08-17 ENCOUNTER — Other Ambulatory Visit: Payer: Self-pay

## 2018-08-17 ENCOUNTER — Ambulatory Visit
Admission: EM | Admit: 2018-08-17 | Discharge: 2018-08-17 | Disposition: A | Discharge status: Home / Self Care | Payer: No Typology Code available for payment source | Attending: Family Medicine | Admitting: Family Medicine

## 2018-08-17 DIAGNOSIS — Z20828 Contact with and (suspected) exposure to other viral communicable diseases: Secondary | ICD-10-CM

## 2018-08-17 DIAGNOSIS — Z20822 Contact with and (suspected) exposure to covid-19: Secondary | ICD-10-CM

## 2018-08-17 NOTE — ED Triage Notes (Signed)
Patient states that she was exposed to Covid by a co-worker. Patient states that health at work will not swab her until Monday. Patient has no symptoms.

## 2018-08-18 NOTE — ED Provider Notes (Signed)
[facility]    CSN: [id number] Arrival date & time: 08/17/18  1726  History   Chief Complaint Chief Complaint  Patient presents with  . Covid Exposure   HPI  28 year old female presents with the above complaint.  Patient is a healthcare provider.  She has been in close contact with a coworker who has tested positive for COVID-19.   She is concerned about her exposure and desires to be tested.  She states that health at work would not test her until Monday.  She is currently feeling well.  No cough, fever, shortness of breath.  No other reported symptoms or concerns at this time.  PMH, Surgical Hx, Family Hx, Social History reviewed and updated as below.  Past Medical History:  Diagnosis Date  . PCOS (polycystic ovarian syndrome)    Patient Active Problem List   Diagnosis Date Noted  . PCOS (polycystic ovarian syndrome) 04/17/2018  . Elevated LDL cholesterol level 04/17/2018  . Rectal bleeding 04/17/2018  . IUD (intrauterine device) in place 04/17/2018  . Constipation 04/17/2018  . FH: colon cancer 07/25/2014  . Well adult 09/05/2013    Past Surgical History:  Procedure Laterality Date  . WISDOM TOOTH EXTRACTION      OB History   No obstetric history on file.      Home Medications    Prior to Admission medications   Medication Sig Start Date End Date Taking? Authorizing Provider  fexofenadine-pseudoephedrine (ALLEGRA-D 24) 180-240 MG 24 hr tablet Take 1 tablet by mouth daily.   Yes [provider]  levonorgestrel (MIRENA) 20 MCG/24HR IUD by Intrauterine route.   Yes [provider]    Family History Family History  Problem Relation Age of Onset  . Colon cancer Mother 57       Passed away from Colorectal Cancer  . Ulcerative colitis Sister   . Colon polyps Sister   . Prostate cancer Maternal Grandfather   . Diabetes Maternal Grandfather   . Heart disease Paternal Grandfather   . Heart attack Paternal Grandfather   . Leukemia  Paternal Grandmother   . Hypertension Father   . Diabetes Maternal Grandmother   . Stroke Maternal Grandmother     Social History Social History   Tobacco Use  . Smoking status: Never Smoker  . Smokeless tobacco: Never Used  Substance Use Topics  . Alcohol use: No    Alcohol/week: 0.0 standard drinks  . Drug use: No     Allergies   Amoxicillin and Sulfa antibiotics   Review of Systems Review of Systems  Constitutional: Negative.   HENT: Negative.   Respiratory: Negative.    Physical Exam Triage Vital Signs ED Triage Vitals  Enc Vitals Group     BP 08/17/18 1739 122/67     Pulse Rate 08/17/18 1739 (!) 105     Resp 08/17/18 1739 16     Temp 08/17/18 1739 98.7 F (37.1 C)     Temp Source 08/17/18 1739 Oral     SpO2 08/17/18 1739 100 %     Weight 08/17/18 1737 126 lb (57.2 kg)     Height 08/17/18 1737 5\' 2"  (1.575 m)     Head Circumference --      Peak Flow --      Pain Score 08/17/18 1736 0     Pain Loc --      Pain Edu? --      Excl. in [place]? --    Updated Vital Signs BP 122/67 (BP  Location: Left Arm)   Pulse (!) 105   Temp 98.7 F (37.1 C) (Oral)   Resp 16   Ht 5\' 2"  (1.575 m)   Wt 57.2 kg   SpO2 100%   BMI 23.05 kg/m   Visual Acuity Right Eye Distance:   Left Eye Distance:   Bilateral Distance:    Right Eye Near:   Left Eye Near:    Bilateral Near:     Physical Exam Vitals signs and nursing note reviewed.  Constitutional:      General: She is not in acute distress.    Appearance: Normal appearance.  HENT:     Head: Normocephalic and atraumatic.  Eyes:     General:        Right eye: No discharge.        Left eye: No discharge.     Conjunctiva/sclera: Conjunctivae normal.  Cardiovascular:     Rate and Rhythm: Normal rate and regular rhythm.  Pulmonary:     Effort: Pulmonary effort is normal. No respiratory distress.  Neurological:     Mental Status: She is alert.  Psychiatric:        Mood and Affect: Mood normal.        Behavior:  Behavior normal.    UC Treatments / Results  Labs (all labs ordered are listed, but only abnormal results are displayed) Labs Reviewed  NOVEL CORONAVIRUS, NAA (HOSPITAL ORDER, SEND-OUT TO REF LAB)    EKG   Radiology No results found.  Procedures Procedures (including critical care time)  Medications Ordered in UC Medications - No data to display  Initial Impression / Assessment and Plan / UC Course  I have reviewed the triage vital signs and the nursing notes.  Pertinent labs & imaging results that were available during my care of the patient were reviewed by me and considered in my medical decision making (see chart for details).    28 year old female presents with exposure to COVID-19.  Testing done today.  Will await results.  Final Clinical Impressions(s) / UC Diagnoses   Final diagnoses:  Exposure to Covid-19 Virus   Discharge Instructions   None    ED Prescriptions    None     Controlled Substance Prescriptions [place] Controlled Substance Registry consulted? Not Applicable   Tommie SamsCook, Toan Mort G, [place] 08/18/18 [id number]

## 2018-08-20 LAB — NOVEL CORONAVIRUS, NAA (HOSP ORDER, SEND-OUT TO REF LAB; TAT 18-24 HRS): SARS-CoV-2, NAA: NOT DETECTED

## 2018-08-21 ENCOUNTER — Encounter (HOSPITAL_COMMUNITY): Payer: Self-pay

## 2018-09-10 ENCOUNTER — Encounter: Payer: Self-pay | Admitting: Family Medicine

## 2018-09-10 DIAGNOSIS — S99922A Unspecified injury of left foot, initial encounter: Secondary | ICD-10-CM

## 2018-09-11 ENCOUNTER — Ambulatory Visit
Admission: RE | Admit: 2018-09-11 | Discharge: 2018-09-11 | Disposition: A | Discharge status: Home / Self Care | Payer: No Typology Code available for payment source | Attending: Family Medicine | Admitting: Family Medicine

## 2018-09-11 ENCOUNTER — Ambulatory Visit
Admission: RE | Admit: 2018-09-11 | Discharge: 2018-09-11 | Disposition: A | Discharge status: Home / Self Care | Payer: No Typology Code available for payment source | Source: Ambulatory Visit | Attending: Family Medicine | Admitting: Family Medicine

## 2018-09-11 ENCOUNTER — Other Ambulatory Visit: Payer: Self-pay

## 2018-09-11 DIAGNOSIS — S99922A Unspecified injury of left foot, initial encounter: Secondary | ICD-10-CM | POA: Insufficient documentation

## 2018-10-01 ENCOUNTER — Telehealth: Payer: Self-pay

## 2018-10-01 NOTE — Telephone Encounter (Signed)
Mychart message sent.

## 2018-10-01 NOTE — Telephone Encounter (Signed)
Sent my chart message to remind pt to contact office to schedule colonoscopy.

## 2018-11-01 ENCOUNTER — Encounter: Payer: Self-pay | Admitting: Family Medicine

## 2018-11-01 DIAGNOSIS — Z124 Encounter for screening for malignant neoplasm of cervix: Secondary | ICD-10-CM

## 2018-11-20 ENCOUNTER — Encounter: Payer: No Typology Code available for payment source | Admitting: Family Medicine

## 2019-01-03 ENCOUNTER — Encounter: Payer: Self-pay | Admitting: Family Medicine

## 2019-01-03 DIAGNOSIS — R300 Dysuria: Secondary | ICD-10-CM

## 2019-01-04 MED ORDER — NITROFURANTOIN MONOHYD MACRO 100 MG PO CAPS: 100.0000 mg | ORAL_CAPSULE | Freq: Two times a day (BID) | ORAL | 0 refills | Status: AC

## 2019-01-04 NOTE — Addendum Note (Signed)
Addended by: Hubbard Hartshorn on: 01/04/2019 11:53 AM   Modules accepted: Orders

## 2019-01-05 LAB — URINALYSIS
Bilirubin Urine: NEGATIVE
Glucose, UA: NEGATIVE
Hgb urine dipstick: NEGATIVE
Ketones, ur: NEGATIVE
Leukocytes,Ua: NEGATIVE
Nitrite: NEGATIVE
Protein, ur: NEGATIVE
Specific Gravity, Urine: 1.004 (ref 1.001–1.03)
pH: 7.5 (ref 5.0–8.0)

## 2019-01-05 LAB — URINE CULTURE
MICRO NUMBER:: 1161860
Result:: NO GROWTH
SPECIMEN QUALITY:: ADEQUATE

## 2019-01-21 ENCOUNTER — Other Ambulatory Visit: Payer: No Typology Code available for payment source

## 2019-06-21 ENCOUNTER — Other Ambulatory Visit: Payer: Self-pay

## 2019-06-21 ENCOUNTER — Ambulatory Visit (INDEPENDENT_AMBULATORY_CARE_PROVIDER_SITE_OTHER): Payer: No Typology Code available for payment source | Admitting: Family Medicine

## 2019-06-21 ENCOUNTER — Encounter: Payer: Self-pay | Admitting: Family Medicine

## 2019-06-21 VITALS — BP 108/62 | HR 105 | Temp 98.3°F | Ht 62.0 in | Wt 130.8 lb

## 2019-06-21 DIAGNOSIS — R300 Dysuria: Secondary | ICD-10-CM | POA: Diagnosis not present

## 2019-06-21 LAB — POCT URINALYSIS DIPSTICK
Bilirubin, UA: NEGATIVE
Blood, UA: NEGATIVE
Glucose, UA: NEGATIVE
Ketones, UA: NEGATIVE
Leukocytes, UA: NEGATIVE
Nitrite, UA: NEGATIVE
Odor: NORMAL
Protein, UA: NEGATIVE
Spec Grav, UA: 1.02 (ref 1.010–1.025)
Urobilinogen, UA: 0.2 E.U./dL
pH, UA: 6 (ref 5.0–8.0)

## 2019-06-21 NOTE — Progress Notes (Signed)
Patient ID: Shirley Chavez, female    DOB: 22-Jun-1990, 29 y.o.   MRN: [id number]  PCP: Hubbard Hartshorn, FNP  No chief complaint on file.   Subjective:   Shirley Chavez is a 29 y.o. female, presents to clinic with CC of the following:  HPI  Concern for UTI She has urinary sx intermittently, sx started last week, she had burning/dysuria No frequency urgency, pelvic pain, back pain, hematuria.  No vaginal sx.   Her symptoms had started to improve but she wanted to keep the appointment just to make sure because she is going out of town soon.  Reviewed the last year or so of office visits here and elsewhere with several for dysuria or urinary symptoms, most recent culture was negative, patient states that she does not want antibiotics today but she would like a culture to reassure her.  Patient Active Problem List   Diagnosis Date Noted  . PCOS (polycystic ovarian syndrome) 04/17/2018  . Elevated LDL cholesterol level 04/17/2018  . Rectal bleeding 04/17/2018  . IUD (intrauterine device) in place 04/17/2018  . Constipation 04/17/2018  . FH: colon cancer 07/25/2014  . Well adult 09/05/2013    Current Outpatient Medications:  .  fexofenadine-pseudoephedrine (ALLEGRA-D 24) 180-240 MG 24 hr tablet, Take 1 tablet by mouth daily., Disp: , Rfl:  .  levonorgestrel (MIRENA) 20 MCG/24HR IUD, by Intrauterine route., Disp: , Rfl:   Allergies  Allergen Reactions  . Amoxicillin Rash  . Sulfa Antibiotics Hives     Family History  Problem Relation Age of Onset  . Colon cancer Mother 74       Passed away from Colorectal Cancer  . Ulcerative colitis Sister   . Colon polyps Sister   . Prostate cancer Maternal Grandfather   . Diabetes Maternal Grandfather   . Heart disease Paternal Grandfather   . Heart attack Paternal Grandfather   . Leukemia Paternal Grandmother   . Hypertension Father   . Diabetes Maternal Grandmother   . Stroke Maternal Grandmother      Social History    Socioeconomic History  . Marital status: Married    Spouse name: Issack  . Number of children: 0  . Years of education: 3  . Highest education level: Bachelor's degree (e.g., BA, AB, BS)  Occupational History  . Not on file  Tobacco Use  . Smoking status: Never Smoker  . Smokeless tobacco: Never Used  Substance and Sexual Activity  . Alcohol use: No    Alcohol/week: 0.0 standard drinks  . Drug use: No  . Sexual activity: Yes  Other Topics Concern  . Not on file  Social History Narrative  . Not on file   Social Determinants of Health   Financial Resource Strain:   . Difficulty of Paying Living Expenses:   Food Insecurity:   . Worried About Charity fundraiser in the Last Year:   . Arboriculturist in the Last Year:   Transportation Needs:   . Film/video editor (Medical):   Marland Kitchen Lack of Transportation (Non-Medical):   Physical Activity:   . Days of Exercise per Week:   . Minutes of Exercise per Session:   Stress:   . Feeling of Stress :   Social Connections:   . Frequency of Communication with Friends and Family:   . Frequency of Social Gatherings with Friends and Family:   . Attends Religious Services:   . Active Member of Clubs or Organizations:   . Attends  Club or Organization Meetings:   Marland Kitchen Marital Status:   Intimate Partner Violence:   . Fear of Current or Ex-Partner:   . Emotionally Abused:   Marland Kitchen Physically Abused:   . Sexually Abused:     Chart Review Today: I personally reviewed active problem list, medication list, allergies, family history, social history, health maintenance, notes from last encounter, lab results, imaging with the patient/caregiver today.   Review of Systems 10 Systems reviewed and are negative for acute change except as noted in the HPI.     Objective:   Vitals:   06/21/19 1320  BP: 108/62  Pulse: (!) 105  Temp: 98.3 F (36.8 C)  SpO2: 98%  Weight: 130 lb 12.8 oz (59.3 kg)  Height: 5\' 2"  (1.575 m)    Body mass index  is 23.92 kg/m.  Physical Exam Vitals and nursing note reviewed.  Constitutional:      General: She is not in acute distress.    Appearance: Normal appearance. She is well-developed. She is not ill-appearing, toxic-appearing or diaphoretic.  HENT:     Head: Normocephalic and atraumatic.  Eyes:     General:        Right eye: No discharge.        Left eye: No discharge.     Conjunctiva/sclera: Conjunctivae normal.  Neck:     Trachea: No tracheal deviation.  Cardiovascular:     Rate and Rhythm: Normal rate and regular rhythm.     Pulses: Normal pulses.     Heart sounds: Normal heart sounds.  Pulmonary:     Effort: Pulmonary effort is normal. No respiratory distress.     Breath sounds: Normal breath sounds.  Abdominal:     General: Abdomen is flat. Bowel sounds are normal. There is no distension.     Tenderness: There is no abdominal tenderness. There is no right CVA tenderness, left CVA tenderness or guarding.  Skin:    General: Skin is warm and dry.     Findings: No rash.  Neurological:     Mental Status: She is alert.     Motor: No abnormal muscle tone.     Coordination: Coordination normal.  Psychiatric:        Mood and Affect: Mood normal.        Behavior: Behavior normal.         Results for orders placed or performed in visit on 06/21/19  POCT urinalysis dipstick  Result Value Ref Range   Color, UA yellow    Clarity, UA clear    Glucose, UA Negative Negative   Bilirubin, UA neg    Ketones, UA neg    Spec Grav, UA 1.020 1.010 - 1.025   Blood, UA neg    pH, UA 6.0 5.0 - 8.0   Protein, UA Negative Negative   Urobilinogen, UA 0.2 0.2 or 1.0 E.U./dL   Nitrite, UA neg    Leukocytes, UA Negative Negative   Appearance clear    Odor normal         Assessment & Plan:      ICD-10-CM   1. Dysuria  R30.0 POCT urinalysis dipstick    Urine Culture  Improving symptoms, onset 1 week ago, no constitutional symptoms, abdominal exam benign, vital signs stable,  urine dip was unremarkable.  Will send for culture. Pt reports that macrobid does well for her if culture is positive    06/23/19, PA-C 06/21/19 1:11 PM

## 2019-06-22 LAB — URINE CULTURE
MICRO NUMBER:: 10507575
Result:: NO GROWTH
SPECIMEN QUALITY:: ADEQUATE

## 2019-08-30 ENCOUNTER — Other Ambulatory Visit: Payer: Self-pay | Admitting: Physician Assistant

## 2019-08-30 MED ORDER — KETOCONAZOLE 2 % EX SHAM: 1.0000 "application " | MEDICATED_SHAMPOO | CUTANEOUS | 0 refills | Status: DC

## 2019-10-09 ENCOUNTER — Other Ambulatory Visit: Payer: Self-pay | Admitting: Certified Nurse Midwife

## 2020-03-13 ENCOUNTER — Other Ambulatory Visit: Payer: Self-pay | Admitting: Physician Assistant

## 2020-03-13 MED ORDER — KETOCONAZOLE 2 % EX SHAM: 1.0000 "application " | MEDICATED_SHAMPOO | CUTANEOUS | 0 refills | Status: DC

## 2020-05-19 ENCOUNTER — Other Ambulatory Visit: Payer: Self-pay

## 2020-05-19 MED FILL — Norethindrone Tab 0.35 MG: ORAL | 84 days supply | Qty: 84 | Fill #0 | Status: AC

## 2020-07-20 IMAGING — CR LEFT FOOT - COMPLETE 3+ VIEW
1 series · 3 of 3 positions shown · non-contrast
Comparison: None.

CLINICAL DATA: Stubbed toe several days ago with persistent pain,
initial encounter

EXAM:
LEFT FOOT - COMPLETE 3+ VIEW

[Series 1: dg foot complete left · 0.14mm/px · 3 of 3 slices shown]
[im 1/3]
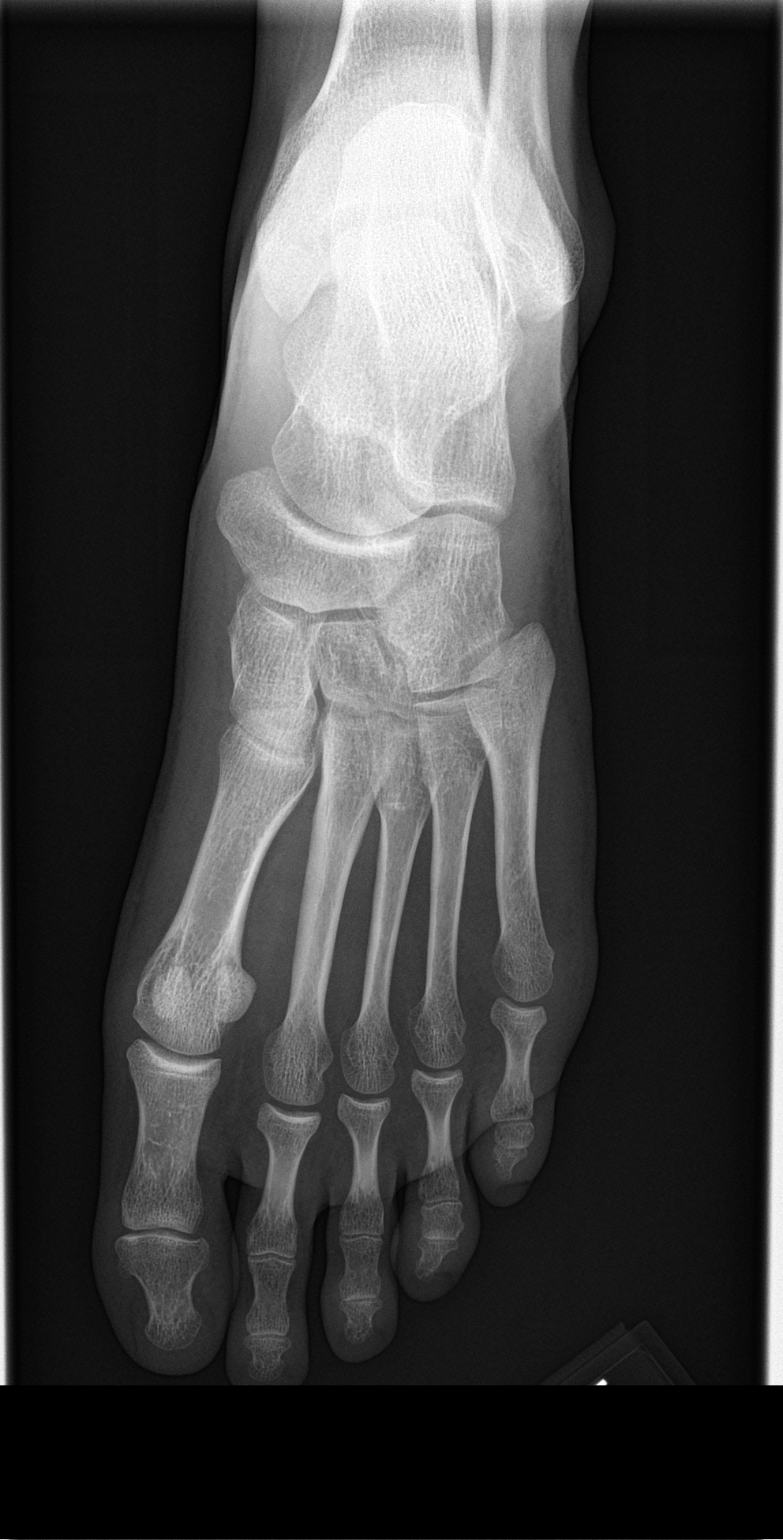
[im 2/3]
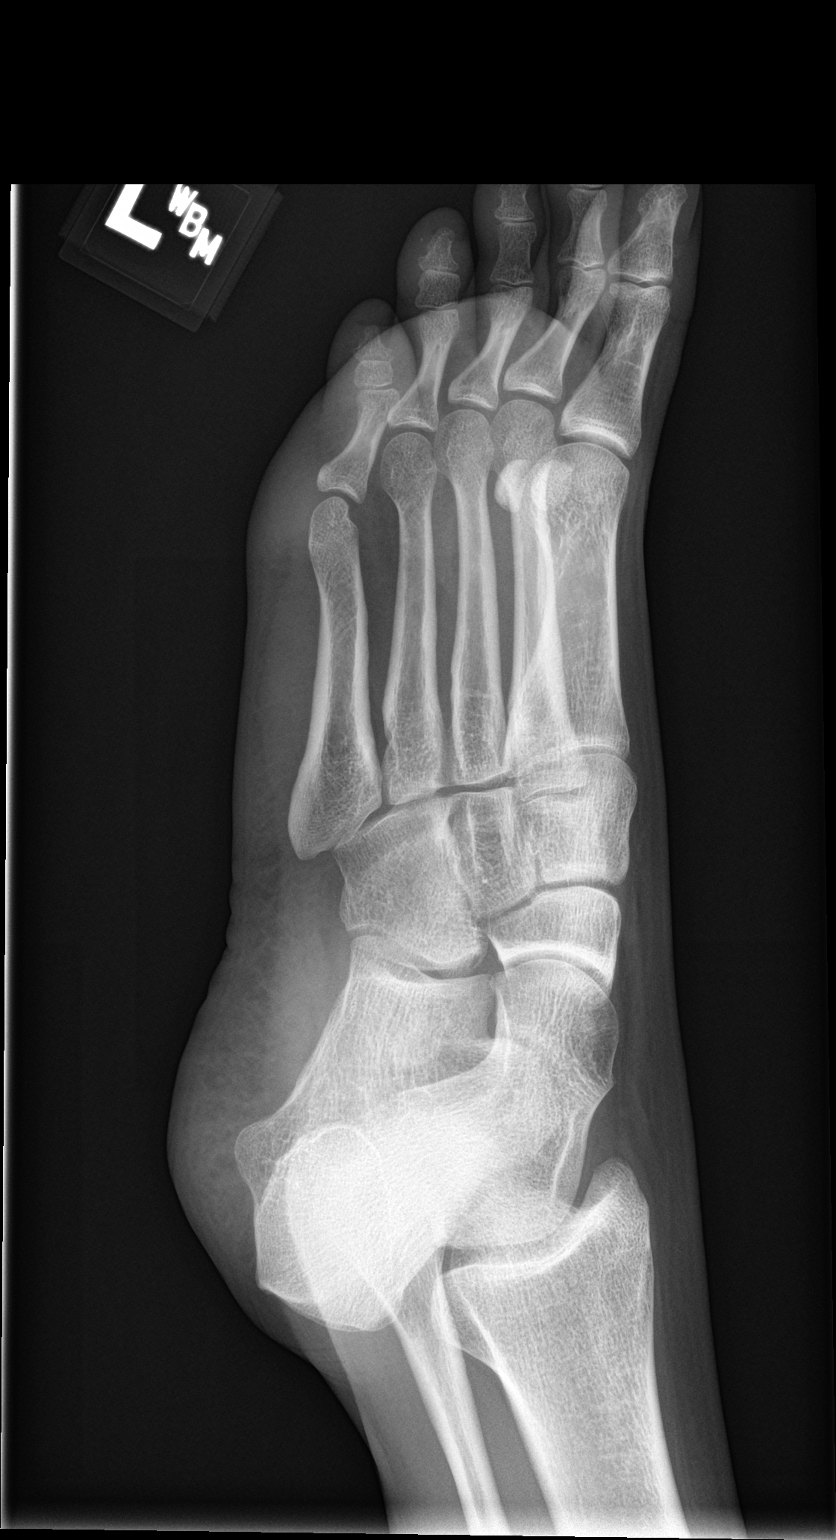
[im 3/3]
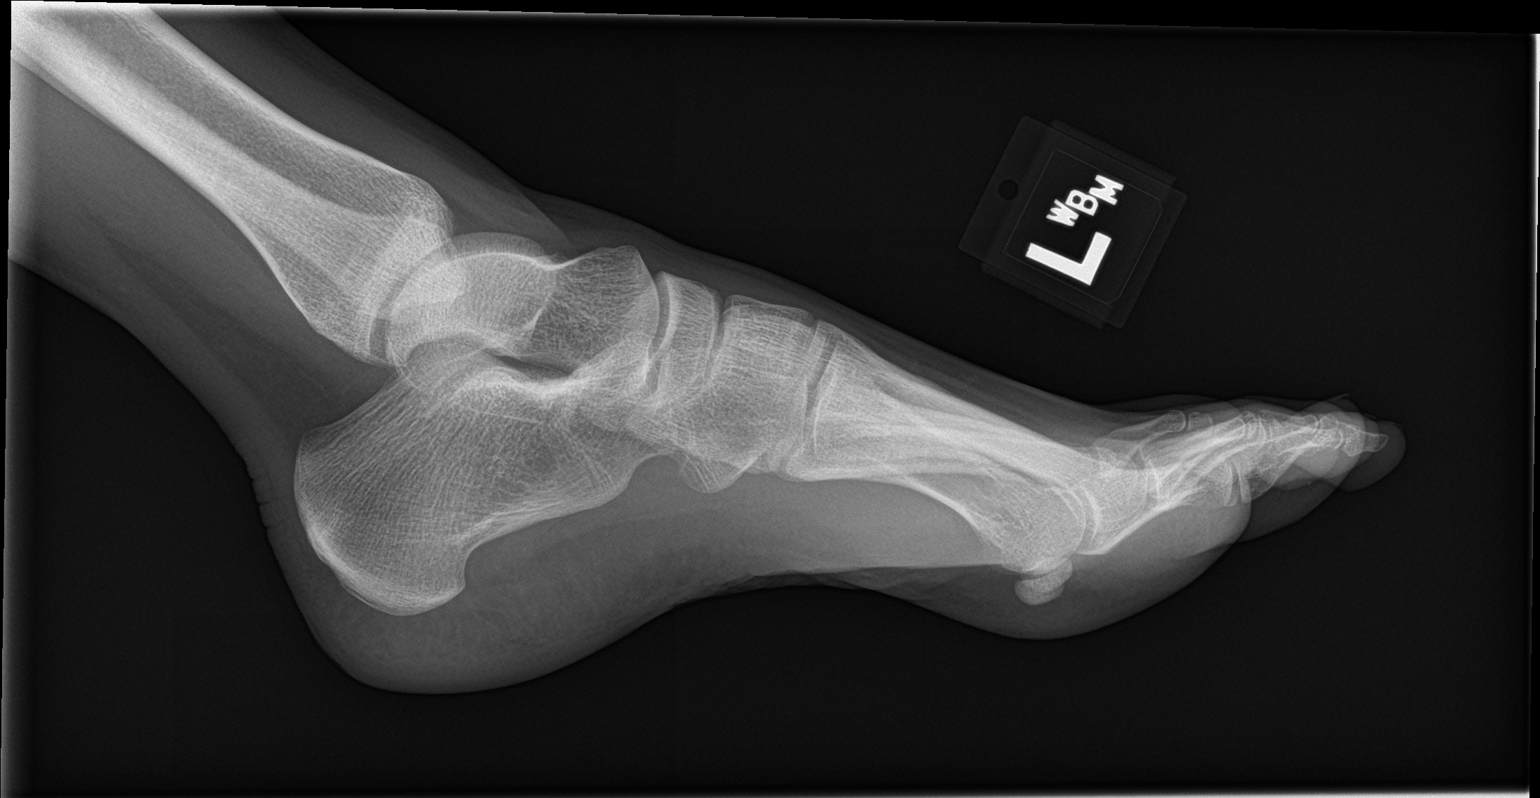

[3 of 3 positions shown; findings below may reference images not displayed]

FINDINGS: No acute fracture or dislocation is noted. No soft tissue
abnormality is noted.
IMPRESSION: No acute abnormality noted.

## 2021-09-01 ENCOUNTER — Telehealth (INDEPENDENT_AMBULATORY_CARE_PROVIDER_SITE_OTHER): Payer: Self-pay | Admitting: Nurse Practitioner

## 2021-09-01 ENCOUNTER — Other Ambulatory Visit: Payer: Self-pay

## 2021-09-01 ENCOUNTER — Encounter: Payer: Self-pay | Admitting: Nurse Practitioner

## 2021-09-01 DIAGNOSIS — Z3041 Encounter for surveillance of contraceptive pills: Secondary | ICD-10-CM

## 2021-09-01 DIAGNOSIS — Z0182 Encounter for allergy testing: Secondary | ICD-10-CM

## 2021-09-01 MED ORDER — NORETHINDRONE 0.35 MG PO TABS
1.0000 | ORAL_TABLET | Freq: Every day | ORAL | 12 refills | Status: AC
Start: 2021-09-01 — End: ?

## 2021-09-01 NOTE — Progress Notes (Signed)
Name: Shirley Chavez   MRN: [id number]    DOB: [birth year]   Date:09/01/2021       Progress Note  Subjective  Chief Complaint  Chief Complaint  Patient presents with   Medication Refill   Allergic Rhinitis     I connected with  Osvaldo Angst  on 09/01/21 at  8:40 AM EDT by a video enabled telemedicine application and verified that I am speaking with the correct person using two identifiers.  I discussed the limitations of evaluation and management by telemedicine and the availability of in person appointments. The patient expressed understanding and agreed to proceed with a virtual visit  Staff also discussed with the patient that there may be a patient responsible charge related to this service. Patient Location: work Dispensing optician: [facility] Additional Individuals present: alone  HPI  Birth control: Patient has been on norethindrone 0.35 mg daily for years.  Patient states she tolerates it well has not had any side effects.  She is not a smoker.  We will send in refills.  Allergies: Patient reports that she did have a tick bite many years ago.  And then had a bad reaction to eating red meat.  Patient states she has not eaten red meat in years but now she is getting concerned about red meat touching anything that she eats.  She would like to have alpha gal testing done to check for this allergy.  Patient Active Problem List   Diagnosis Date Noted   PCOS (polycystic ovarian syndrome) 04/17/2018   Elevated LDL cholesterol level 04/17/2018   Rectal bleeding 04/17/2018   IUD (intrauterine device) in place 04/17/2018   Constipation 04/17/2018   FH: colon cancer 07/25/2014   Well adult 09/05/2013    Social History   Tobacco Use   Smoking status: Never   Smokeless tobacco: Never  Substance Use Topics   Alcohol use: No    Alcohol/week: 0.0 standard drinks of alcohol     Current Outpatient Medications:    fluticasone (FLONASE) 50 MCG/ACT nasal spray, Place into both nostrils  daily., Disp: , Rfl:    norethindrone (MICRONOR) 0.35 MG tablet, Take 1 tablet by mouth daily., Disp: , Rfl:    fexofenadine (ALLEGRA ALLERGY) 180 MG tablet, Take 180 mg by mouth daily., Disp: , Rfl:    ketoconazole (NIZORAL) 2 % shampoo, Apply 1 application topically 2 (two) times a week., Disp: 120 mL, Rfl: 0   norethindrone (MICRONOR) 0.35 MG tablet, TAKE 1 TABLET BY MOUTH ONCE DAILY, Disp: 84 tablet, Rfl: 3  Allergies  Allergen Reactions   Amoxicillin Rash   Sulfa Antibiotics Hives    I personally reviewed active problem list, medication list, allergies with the patient/caregiver today.  ROS  Constitutional: Negative for fever or weight change.  Respiratory: Negative for cough and shortness of breath.   Cardiovascular: Negative for chest pain or palpitations.  Gastrointestinal: Negative for abdominal pain, no bowel changes.  Musculoskeletal: Negative for gait problem or joint swelling.  Skin: Negative for rash.  Neurological: Negative for dizziness or headache.  No other specific complaints in a complete review of systems (except as listed in HPI above).   Objective  Virtual encounter, vitals not obtained.  There is no height or weight on file to calculate BMI.  Nursing Note and Vital Signs reviewed.  Physical Exam  Awake, alert, oriented x3.  Speaking in complete sentences.  No results found for this or any previous visit (from the past 72 hour(s)).  Assessment & Plan  1. Encounter for birth control pills maintenance Comments: Refill sent of norethindrone 0.35 mg daily. No changes in therapy. - norethindrone (MICRONOR) 0.35 MG tablet; Take 1 tablet (0.35 mg total) by mouth daily.  Dispense: 28 tablet; Refill: 12  2. Encounter for allergy testing Comments: Will get alpha gal testing done, due to concern of red meat allergy after tick bite - Alpha-Gal Panel    -Red flags and when to present for emergency care or RTC including fever >101.38F, chest pain, shortness  of breath, new/worsening/un-resolving symptoms,  reviewed with patient at time of visit. Follow up and care instructions discussed and provided in AVS. - I discussed the assessment and treatment plan with the patient. The patient was provided an opportunity to ask questions and all were answered. The patient agreed with the plan and demonstrated an understanding of the instructions.  I provided 15 minutes of non-face-to-face time during this encounter.  Berniece Salines, FNP

## 2021-09-04 LAB — ALPHA-GAL PANEL
Allergen Lamb IgE: <0.1 kU/L
Beef IgE: <0.1 kU/L
IgE (Immunoglobulin E), Serum: 54 IU/mL (ref 6–495)
O215-IgE Alpha-Gal: <0.1 kU/L
Pork IgE: <0.1 kU/L

## 2022-04-08 ENCOUNTER — Ambulatory Visit
Admission: EM | Admit: 2022-04-08 | Discharge: 2022-04-08 | Disposition: A | Discharge status: Home / Self Care | Payer: No Typology Code available for payment source | Attending: Family Medicine | Admitting: Family Medicine

## 2022-04-08 DIAGNOSIS — Z8379 Family history of other diseases of the digestive system: Secondary | ICD-10-CM | POA: Insufficient documentation

## 2022-04-08 DIAGNOSIS — E876 Hypokalemia: Secondary | ICD-10-CM | POA: Insufficient documentation

## 2022-04-08 DIAGNOSIS — R197 Diarrhea, unspecified: Secondary | ICD-10-CM | POA: Insufficient documentation

## 2022-04-08 LAB — CBC WITH DIFFERENTIAL/PLATELET
Abs Immature Granulocytes: 0.02 10*3/uL (ref 0.00–0.07)
Basophils Absolute: 0 10*3/uL (ref 0.0–0.1)
Basophils Relative: 1 %
Eosinophils Absolute: 0 10*3/uL (ref 0.0–0.5)
Eosinophils Relative: 1 %
HCT: 37.7 % (ref 36.0–46.0)
Hemoglobin: 13.1 g/dL (ref 12.0–15.0)
Immature Granulocytes: 0 %
Lymphocytes Relative: 35 %
Lymphs Abs: 1.9 10*3/uL (ref 0.7–4.0)
MCH: 29.8 pg (ref 26.0–34.0)
MCHC: 34.7 g/dL (ref 30.0–36.0)
MCV: 85.7 fL (ref 80.0–100.0)
Monocytes Absolute: 0.6 10*3/uL (ref 0.1–1.0)
Monocytes Relative: 11 %
Neutro Abs: 2.8 10*3/uL (ref 1.7–7.7)
Neutrophils Relative %: 52 %
Platelets: 250 10*3/uL (ref 150–400)
RBC: 4.4 MIL/uL (ref 3.87–5.11)
RDW: 11.4 % — ABNORMAL LOW (ref 11.5–15.5)
WBC: 5.4 10*3/uL (ref 4.0–10.5)
nRBC: 0 % (ref 0.0–0.2)

## 2022-04-08 LAB — COMPREHENSIVE METABOLIC PANEL
ALT: 23 U/L (ref 0–44)
AST: 33 U/L (ref 15–41)
Albumin: 4.3 g/dL (ref 3.5–5.0)
Alkaline Phosphatase: 57 U/L (ref 38–126)
Anion gap: 10 (ref 5–15)
BUN: 7 mg/dL (ref 6–20)
CO2: 22 mmol/L (ref 22–32)
Calcium: 8.8 mg/dL — ABNORMAL LOW (ref 8.9–10.3)
Chloride: 101 mmol/L (ref 98–111)
Creatinine, Ser: 0.78 mg/dL (ref 0.44–1.00)
GFR, Estimated: >60 mL/min (ref >60)
Glucose, Bld: 109 mg/dL — ABNORMAL HIGH (ref 70–99)
Potassium: 2.6 mmol/L — CL (ref 3.5–5.1)
Sodium: 133 mmol/L — ABNORMAL LOW (ref 135–145)
Total Bilirubin: 0.6 mg/dL (ref 0.3–1.2)
Total Protein: 7.7 g/dL (ref 6.5–8.1)

## 2022-04-08 LAB — C DIFFICILE QUICK SCREEN W PCR REFLEX
C Diff antigen: NEGATIVE
C Diff interpretation: NOT DETECTED
C Diff toxin: NEGATIVE

## 2022-04-08 MED ORDER — SODIUM CHLORIDE 0.9 % IV BOLUS: 1000.0000 mL | Freq: Once | INTRAVENOUS | Status: DC

## 2022-04-08 NOTE — ED Provider Notes (Signed)
[facility]    CSN: [id number] Arrival date & time: 04/08/22  1659      History   Chief Complaint Chief Complaint  Patient presents with   Diarrhea   Vomiting    HPI Shirley Chavez is a 32 y.o. female.   HPI  Shirley Chavez presents for persistent yellow diarrhea since Monday. She had chills, nausea and vomiting once. She is having 20-30 episodes of diarrhea a day. It wakes up her up at night. She has pure liquid and has had stool incontinence.  Her husband doesn't have similar sx and they eat the same food. Little stool is present anymore when she has a bowel movement.  Feels like she can eat anything but a Ritz cracker pack a day. She had horrible abdominal pain in the beginning. No blood in stool. She hasn't eaten anything since Monday. She lost 7 lbs. Her anxiety is "off the hook."  There has been no new foods, supplements, restaurants or medications.  Her sister has ulcerative colitis and mom died of colon cancer in her early 17s. 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  Past Surgeries: no abdominal surgeries   Symptoms Nausea/Vomiting: yes Diarrhea: yes Constipation: no  Melena/BRBPR: no  Hematemesis: no  Anorexia: yes  Fever/Chills: chills but no fever  Dysuria: no  Rash: no  Wt loss: yes  LMP: Patient's last menstrual period was 04/08/2022. Sore throat: no   Cough: no Nasal congestion : no  Sleep disturbance: yes Back Pain: no Headache: no   Past Medical History:  Diagnosis Date   PCOS (polycystic ovarian syndrome)     Patient Active Problem List   Diagnosis Date Noted   PCOS (polycystic ovarian syndrome) 04/17/2018   Elevated LDL cholesterol level 04/17/2018   Rectal bleeding 04/17/2018   IUD (intrauterine device) in place 04/17/2018   Constipation 04/17/2018   FH: colon cancer 07/25/2014   Well adult 09/05/2013    Past Surgical History:  Procedure Laterality Date   WISDOM TOOTH EXTRACTION      OB History    No obstetric history on file.      Home Medications    Prior to Admission medications   Medication Sig Start Date End Date Taking? Authorizing Provider  fexofenadine (ALLEGRA ALLERGY) 180 MG tablet Take 180 mg by mouth daily.    [provider]  fluticasone (FLONASE) 50 MCG/ACT nasal spray Place into both nostrils daily.    [provider]  norethindrone (MICRONOR) 0.35 MG tablet Take 1 tablet (0.35 mg total) by mouth daily. 09/01/21   Bo Merino, FNP    Family History Family History  Problem Relation Age of Onset   Colon cancer Mother 25       Passed away from Colorectal Cancer   Ulcerative colitis Sister    Colon polyps Sister    Prostate cancer Maternal Grandfather    Diabetes Maternal Grandfather    Heart disease Paternal Grandfather    Heart attack Paternal Grandfather    Leukemia Paternal Grandmother    Hypertension Father    Diabetes Maternal Grandmother    Stroke Maternal Grandmother     Social History Social History   Tobacco Use   Smoking status: Never   Smokeless tobacco: Never  Vaping Use   Vaping Use: Never used  Substance Use Topics   Alcohol use: No    Alcohol/week: 0.0 standard drinks of alcohol   Drug use: No     Allergies  Amoxicillin and Sulfa antibiotics   Review of Systems Review of Systems :negative unless otherwise stated in HPI.      Physical Exam Triage Vital Signs ED Triage Vitals  Enc Vitals Group     BP 04/08/22 1716 133/84     Pulse Rate 04/08/22 1716 92     Resp --      Temp 04/08/22 1716 98.7 F (37.1 C)     Temp Source 04/08/22 1716 Oral     SpO2 04/08/22 1716 98 %     Weight 04/08/22 1714 121 lb (54.9 kg)     Height 04/08/22 1714 '5\' 2"'$  (1.575 m)     Head Circumference --      Peak Flow --      Pain Score 04/08/22 1714 0     Pain Loc --      Pain Edu? --      Excl. in [place]? --    No data found.  Updated Vital Signs BP 133/84 (BP Location: Left Arm)   Pulse 92   Temp 98.7 F (37.1 C)  (Oral)   Ht '5\' 2"'$  (1.575 m)   Wt 54.9 kg   LMP 04/08/2022   SpO2 98%   BMI 22.13 kg/m   Visual Acuity Right Eye Distance:   Left Eye Distance:   Bilateral Distance:    Right Eye Near:   Left Eye Near:    Bilateral Near:     Physical Exam  GEN: pleasant female, who is tearful CV: regular rate and rhythm, no murmurs appreciated  RESP: no increased work of breathing, clear to ascultation bilaterally ABD: Bowel sounds hyperactive in all quadrants. Soft, non-tender, non-distended.  No guarding, no rebound, no appreciable hepatosplenomegaly, no CVA tenderness, negative McBurney's, negative Murphy MSK: no extremity edema SKIN: warm, dry, no rash on visible skin NEURO: alert, moves all extremities appropriately PSYCH: Normal affect, appropriate speech and behavior   UC Treatments / Results  Labs (all labs ordered are listed, but only abnormal results are displayed) Labs Reviewed  GASTROINTESTINAL PANEL BY PCR, STOOL (REPLACES STOOL CULTURE) - Abnormal; Notable for the following components:      Result Value   Astrovirus DETECTED (*)    All other components within normal limits  COMPREHENSIVE METABOLIC PANEL - Abnormal; Notable for the following components:   Sodium 133 (*)    Potassium 2.6 (*)    Glucose, Bld 109 (*)    Calcium 8.8 (*)    All other components within normal limits  CBC WITH DIFFERENTIAL/PLATELET - Abnormal; Notable for the following components:   RDW 11.4 (*)    All other components within normal limits  C DIFFICILE QUICK SCREEN W PCR REFLEX      EKG   Radiology No results found.  Procedures Procedures (including critical care time)  Medications Ordered in UC Medications - No data to display  Initial Impression / Assessment and Plan / UC Course  I have reviewed the triage vital signs and the nursing notes.  Pertinent labs & imaging results that were available during my care of the patient were reviewed by me and considered in my medical decision  making (see chart for details).        Patient is a  32 y.o. female who presents after having insidious severe diarrhea and abdominal pain about 5 days ago.  Vital signs stable.  Overall, patient is well-appearing, well-hydrated, and in no acute distress.  Vital signs stable.  Telana is afebrile.  Exam is not  concerning for an acute abdomen.  She has known family history of IBD and mom with died of colon cancer. Obtained CBC, CMP, GIPP and C difficile testing.    She does not have any abdominal imaging available reviewed.  She was seen at [facility] in March 2020 for rectal bleeding and had notes about diarrhea with constipation.  She was referred to gastroenterology at that time.  Patient was to start getting colonoscopy starting age 37 due to mom passing away at 58 from colon cancer.   CBC normal and C. difficile testing normal.  GI pathogen panel is pending.  CMP showed hypokalemia with critical K 2.6 with mild hyponatremia [id number]  Given her profuse diarrhea she will likely need IV and oral repletion.  Due to ED evaluation and patient states she will go to [facility] urgency department.  She is stable to travel via private vehicle.  Discussed MDM, treatment plan and plan for follow-up with patient  who agrees with plan.    Final Clinical Impressions(s) / UC Diagnoses   Final diagnoses:  Hypokalemia  Diarrhea, unspecified type  Family history of ulcerative colitis     Discharge Instructions      You do not have C. Diff.   Your potassium is 2.6.  I recommend you be evaluated in the ED for repletion.     You have been advised to follow up immediately in the emergency department for concerning signs or symptoms as discussed during your visit. If you declined EMS transport, please have a family member take you directly to the ED at this time. Do not delay.   Based on concerns about condition, if you do not follow up in the ED, you may risk poor outcomes including worsening  of condition, delayed treatment and potentially life threatening issues. If you have declined to go to the ED at this time, you should call your PCP immediately to set up a follow up appointment.   Go to ED for red flag symptoms, including; fevers you cannot reduce with Tylenol/Motrin, severe headaches, vision changes, numbness/weakness in part of the body, lethargy, confusion, intractable vomiting, severe dehydration, chest pain, breathing difficulty, severe persistent abdominal or pelvic pain, signs of severe infection (increased redness, swelling of an area), feeling faint or passing out, dizziness, etc. You should especially go to the ED for sudden acute worsening of condition if you do not elect to go at this time.       ED Prescriptions   None    PDMP not reviewed this encounter.   Lyndee Hensen, DO 04/09/22 1711

## 2022-04-08 NOTE — Discharge Instructions (Addendum)
You do not have C. Diff.   Your potassium is 2.6.  I recommend you be evaluated in the ED for repletion.     You have been advised to follow up immediately in the emergency department for concerning signs or symptoms as discussed during your visit. If you declined EMS transport, please have a family member take you directly to the ED at this time. Do not delay.   Based on concerns about condition, if you do not follow up in the ED, you may risk poor outcomes including worsening of condition, delayed treatment and potentially life threatening issues. If you have declined to go to the ED at this time, you should call your PCP immediately to set up a follow up appointment.   Go to ED for red flag symptoms, including; fevers you cannot reduce with Tylenol/Motrin, severe headaches, vision changes, numbness/weakness in part of the body, lethargy, confusion, intractable vomiting, severe dehydration, chest pain, breathing difficulty, severe persistent abdominal or pelvic pain, signs of severe infection (increased redness, swelling of an area), feeling faint or passing out, dizziness, etc. You should especially go to the ED for sudden acute worsening of condition if you do not elect to go at this time.

## 2022-04-08 NOTE — ED Triage Notes (Signed)
Pt c/o vomiting, diarrhea x5days  Pt states that she has been using the restroom every hour and has lost 7 pounds. Pt last used the restroom at 4pm today.  Pt states that her diarrhea is yellow and no one in her home has diarrhea.  Pt denies eating anything different or trying anything new.

## 2022-04-09 LAB — GASTROINTESTINAL PANEL BY PCR, STOOL (REPLACES STOOL CULTURE)

## 2022-04-15 ENCOUNTER — Ambulatory Visit: Payer: Self-pay | Admitting: Nurse Practitioner
